# Patient Record
Sex: Male | Born: 2000 | Hispanic: Yes | Marital: Single | Smoking: Never smoker
Health system: Southern US, Community
[De-identification: ages and names within clinical notes are randomized; demographics above are authoritative.]

---
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
## 2022-04-04 ENCOUNTER — Encounter: Payer: Self-pay | Admitting: Internal Medicine

## 2022-04-04 ENCOUNTER — Ambulatory Visit (INDEPENDENT_AMBULATORY_CARE_PROVIDER_SITE_OTHER): Payer: Medicaid Other | Admitting: Internal Medicine

## 2022-04-04 VITALS — BP 90/41 | HR 80 | Temp 99.2°F | Resp 14 | Ht 65.0 in | Wt 150.8 lb

## 2022-04-04 DIAGNOSIS — Z0000 Encounter for general adult medical examination without abnormal findings: Secondary | ICD-10-CM

## 2022-04-04 DIAGNOSIS — Z789 Other specified health status: Secondary | ICD-10-CM | POA: Diagnosis not present

## 2022-04-04 DIAGNOSIS — Z119 Encounter for screening for infectious and parasitic diseases, unspecified: Secondary | ICD-10-CM | POA: Diagnosis not present

## 2022-04-04 DIAGNOSIS — Z23 Encounter for immunization: Secondary | ICD-10-CM | POA: Diagnosis not present

## 2022-04-04 DIAGNOSIS — Z025 Encounter for examination for participation in sport: Secondary | ICD-10-CM | POA: Insufficient documentation

## 2022-04-04 NOTE — Assessment & Plan Note (Signed)
Went 10 minutes talking about the deficiencies of the vegetarian diet most likely vitamin D deficiency and so he is supplementing already and he will continue to do it he is also going to add back protein and I encouraged him to eat healthy protein like fish over fatty saturated fat meats

## 2022-04-04 NOTE — Progress Notes (Signed)
Today's healthcare provider: Loralee Pacas, MD  Phone: [phone]  New patient visit  Visit Date: 04/04/2022 Patient: Ryan Barton   DOB: 2000/09/02   21 y.o. Male  MRN: [id number]  Assessment and Plan:   In addition to a comprehensive physical exam we did a sports physical exam as part of today's establish new patient care visit  Ryan Barton was seen today for establish care, mild pain and discuss vegetarian diet.  Preventative health care -     CBC with Differential/Platelet -     Lipid panel -     Comprehensive metabolic panel -     HPV 9-valent vaccine,Recombinat  Screening examination for infectious disease -     Hepatitis C antibody -     HIV Antibody (routine testing w rflx)  Vegetarian diet Overview: Vegetarian diet from ages [age] and is looking at transitioning back to adding meats to increase protein to increase muscle mass for amateur kickboxing  Assessment & Plan: Went 10 minutes talking about the deficiencies of the vegetarian diet most likely vitamin D deficiency and so he is supplementing already and he will continue to do it he is also going to add back protein and I encouraged him to eat healthy protein like fish over fatty saturated fat meats   Routine sports physical exam Overview: This was added on separately from the comprehensive physical exam I completed a  Form letter to release him for without restrictions for 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  Cardiovascular Risk Factor Reduction:   Advised patient of  need for regular exercise and diet rich and fruits and vegetables (high in fiber) and healthy fats (omega-3 unsaturated fats in fish/nuts/avocados/evoo) to reduce risk of heart attack and stroke. Avoid first and 2nd hand smoke and stimulants.   Avoid extreme exercise, only exercise in moderation Wt Readings from Last 3 Encounters:  04/04/22 150 lb 12.8 oz (68.4 kg) (46 %, Z= -0.10)*   * Growth percentiles are based on CDC (Boys, 2-20 Years) data.   Body mass index is 25.09 kg/m. /  Nutrition and Weight Management: recommended a healthy diet. Substance use:  I discussed that my recommendation is total abstinence from all substances of abuse including smoke and 2nd hand smoke, alcohol, illicit drugs, smoking, inhalants, sugar.   Offered to assist with any use disorders or addictions.   STD screening: testing offered today, but patient declined as he considers himself to be low risk based on his sexual history      Sleep Apnea screening:  He  denies any significant problems with sleep quality or hypersomnolence or being advised that he has been told that he snores or has apnea.  Tends to snore but gets restful sleep and declines sleep study.  Depression screening:      04/04/2022    3:14 PM  Depression screen PHQ 2/9  Decreased Interest 0  Down, Depressed, Hopeless 0  PHQ - 2 Score 0  Counseled on value of gratitude exercises, mindfulness, physical exercise, social connections, and good sleep Injury prevention: Discussed safety belts, safety helmets, smoke detectors.  Avoid  adventure sports and high impact exercises. Health maintenance and immunizations reviewed and he was encouraged to complete anything that is due: Immunization History  Administered Date(s) Administered   Influenza,inj,Quad PF,6+ Mos 04/04/2022   Tdap 03/11/2015   Health Maintenance Due  Topic Date Due   HPV VACCINES (1 - Male 2-dose series) Never done   HIV Screening  Never done   Hepatitis C Screening  Never done     Return to care in 1 year for next preventative visit.   Today's Cancer Screening Guidance: Penile & Testicular cancer screening:  He was advised to palpate his testicles, scrotum, and penis for masses and inform me of any.   He agrees to gardasil shot Prostate cancer screening:  Denies family history of prostate cancer or hematospermia so too young for screening by current guidelines.Colon cancer screening:  Denies strong family history of colon cancer or blood in stool so no screening is indicated until age 70.   Skin cancer screening:  Advised regular sunscreen use.  Showed him pictures of melanomas for reference.  He denies worrisome, changing, or new skin lesions.    Health Maintenance  Topic Date Due   HPV VACCINES (1 - Male 2-dose series) Never done   HIV Screening  Never done   Hepatitis C Screening  Never done   TETANUS/TDAP  03/10/2025   INFLUENZA VACCINE  Completed     Recommended follow up: Return in about 1 year (around 04/05/2023).   Subjective:  Patient presents today to establish care.   Chief Complaint  Patient presents with   Establish Care    Ate about two hours ago.    Mild pain    Left knee pops sometimes also.   Discuss vegetarian diet    If any needed nutrients are missing.     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   The following were  reviewed and entered/updated in epic: History reviewed. No pertinent past medical history. Past Surgical History:  Procedure Laterality Date   WISDOM TOOTH EXTRACTION  2023   Past Surgical History:  Procedure Laterality Date   WISDOM TOOTH EXTRACTION  2023   Family Status  Relation Name Status   Mother  Alive   Family History  Problem Relation Age of Onset   Miscarriages / [place] Mother    No outpatient medications prior to visit.   No facility-administered medications prior to visit.    No Known Allergies Social History   Tobacco Use   Smoking status: Never   Smokeless tobacco: Never  Vaping Use   Vaping Use: Former  Substance Use Topics   Alcohol use: Not Currently   Drug use: Never    Immunization History  Administered Date(s) Administered   Influenza,inj,Quad PF,6+ Mos 04/04/2022   Tdap 03/11/2015      Objective:  BP (!) 90/41 (BP Location: Right Arm, Patient Position: Sitting)   Pulse 80   Temp 99.2 F (37.3 C) (Temporal)   Resp 14   Ht 5\' 5"  (1.651 m)   Wt 150 lb 12.8 oz (68.4 kg)   SpO2 97%   BMI 25.09 kg/m  Body mass index is 25.09 kg/m.  He  is a very cordial and polite person who was a pleasure to meet.  Gen: NAD, resting comfortably,  HEENT: Mucous membranes are moist. Sclera conjunctiva and lids grossly normal Neck: no thyromegaly, no cervical lymphadenopathy CV: RRR no murmurs rubs or gallops Lungs: CTAB no crackles, wheeze, rhonchi Abdomen: soft/nontender/nondistended. No rebound or guarding.  Ext: no edema Neuro: grossly intact Slight knee pain with duck walk, no pain, from all joints tested See sports physical for more exam finding.

## 2022-04-05 LAB — COMPREHENSIVE METABOLIC PANEL
ALT: 15 U/L (ref 0–53)
AST: 23 U/L (ref 0–37)
Albumin: 4.6 g/dL (ref 3.5–5.2)
Alkaline Phosphatase: 83 U/L (ref 52–171)
BUN: 19 mg/dL (ref 6–23)
CO2: 29 mEq/L (ref 19–32)
Calcium: 9.6 mg/dL (ref 8.4–10.5)
Chloride: 103 mEq/L (ref 96–112)
Creatinine, Ser: 0.81 mg/dL (ref 0.40–1.50)
GFR: 128.04 mL/min (ref >60.00)
Glucose, Bld: 93 mg/dL (ref 70–99)
Potassium: 4.5 mEq/L (ref 3.5–5.1)
Sodium: 137 mEq/L (ref 135–145)
Total Bilirubin: 0.4 mg/dL (ref 0.2–1.2)
Total Protein: 7.6 g/dL (ref 6.0–8.3)

## 2022-04-05 LAB — LIPID PANEL
Cholesterol: 173 mg/dL (ref 0–200)
HDL: 58.2 mg/dL (ref >39.00)
LDL Cholesterol: 104 mg/dL — ABNORMAL HIGH (ref 0–99)
NonHDL: 115.08
Total CHOL/HDL Ratio: 3
Triglycerides: 56 mg/dL (ref 0.0–149.0)
VLDL: 11.2 mg/dL (ref 0.0–40.0)

## 2022-04-05 LAB — CBC WITH DIFFERENTIAL/PLATELET
Basophils Absolute: 0 10*3/uL (ref 0.0–0.1)
Basophils Relative: 0.9 % (ref 0.0–3.0)
Eosinophils Absolute: 0.1 10*3/uL (ref 0.0–0.7)
Eosinophils Relative: 1.6 % (ref 0.0–5.0)
HCT: 37.7 % (ref 36.0–49.0)
Hemoglobin: 12.7 g/dL (ref 12.0–16.0)
Lymphocytes Relative: 42 % (ref 24.0–48.0)
Lymphs Abs: 1.5 10*3/uL (ref 0.7–4.0)
MCHC: 33.6 g/dL (ref 31.0–37.0)
MCV: 93.9 fl (ref 78.0–98.0)
Monocytes Absolute: 0.5 10*3/uL (ref 0.1–1.0)
Monocytes Relative: 13 % — ABNORMAL HIGH (ref 3.0–12.0)
Neutro Abs: 1.6 10*3/uL (ref 1.4–7.7)
Neutrophils Relative %: 42.5 % — ABNORMAL LOW (ref 43.0–71.0)
Platelets: 260 10*3/uL (ref 150.0–575.0)
RBC: 4.01 Mil/uL (ref 3.80–5.70)
RDW: 14.3 % (ref 11.4–15.5)
WBC: 3.7 10*3/uL — ABNORMAL LOW (ref 4.5–13.5)

## 2022-04-05 LAB — HIV ANTIBODY (ROUTINE TESTING W REFLEX): HIV 1&2 Ab, 4th Generation: NONREACTIVE

## 2022-04-05 LAB — HEPATITIS C ANTIBODY: Hepatitis C Ab: NONREACTIVE

## 2022-04-08 NOTE — Progress Notes (Signed)
   I have reviewed your recent results and, in my medical opinion:  You should probably recheck your white blood cell count in about a year to make sure we do not need to do any further evaluation because it is a little low can do it sooner if you want.  However the biggest concern of HIV has been ruled out with his blood work.  The rest of the lab work just showed a slightly high cholesterol which is not that big deal at your age  Loralee Pacas, MD  04/08/2022 10:11 AM

## 2022-04-10 ENCOUNTER — Other Ambulatory Visit: Payer: Self-pay
